# Patient Record
Sex: Male | Born: 1980 | Race: White | Hispanic: No | Marital: Married | Smoking: Never smoker
Health system: Southern US, Community
[De-identification: ages and names within clinical notes are randomized; demographics above are authoritative.]

## PROBLEM LIST (undated history)

## (undated) DIAGNOSIS — T7840XA Allergy, unspecified, initial encounter: Secondary | ICD-10-CM

## (undated) DIAGNOSIS — J45909 Unspecified asthma, uncomplicated: Secondary | ICD-10-CM

## (undated) HISTORY — DX: Unspecified asthma, uncomplicated: J45.909

## (undated) HISTORY — DX: Allergy, unspecified, initial encounter: T78.40XA

---

## 2014-10-13 ENCOUNTER — Ambulatory Visit (INDEPENDENT_AMBULATORY_CARE_PROVIDER_SITE_OTHER): Payer: BLUE CROSS/BLUE SHIELD | Admitting: Family

## 2014-10-13 ENCOUNTER — Encounter: Payer: Self-pay | Admitting: Family

## 2014-10-13 ENCOUNTER — Other Ambulatory Visit (INDEPENDENT_AMBULATORY_CARE_PROVIDER_SITE_OTHER): Payer: BLUE CROSS/BLUE SHIELD

## 2014-10-13 VITALS — BP 130/88 | HR 79 | Temp 98.4°F | Resp 18 | Ht 70.0 in | Wt 189.1 lb

## 2014-10-13 DIAGNOSIS — Z0000 Encounter for general adult medical examination without abnormal findings: Secondary | ICD-10-CM | POA: Diagnosis not present

## 2014-10-13 LAB — COMPREHENSIVE METABOLIC PANEL
ALK PHOS: 61 U/L (ref 39–117)
ALT: 30 U/L (ref 0–53)
AST: 22 U/L (ref 0–37)
Albumin: 4.3 g/dL (ref 3.5–5.2)
BILIRUBIN TOTAL: 0.8 mg/dL (ref 0.2–1.2)
BUN: 14 mg/dL (ref 6–23)
CHLORIDE: 104 meq/L (ref 96–112)
CO2: 27 mEq/L (ref 19–32)
CREATININE: 0.8 mg/dL (ref 0.40–1.50)
Calcium: 9.4 mg/dL (ref 8.4–10.5)
GFR: 118.02 mL/min (ref >60.00)
Glucose, Bld: 92 mg/dL (ref 70–99)
Potassium: 4.1 mEq/L (ref 3.5–5.1)
Sodium: 137 mEq/L (ref 135–145)
Total Protein: 6.9 g/dL (ref 6.0–8.3)

## 2014-10-13 LAB — LIPID PANEL
CHOLESTEROL: 223 mg/dL — AB (ref 0–200)
HDL: 41.4 mg/dL (ref >39.00)
LDL CALC: 158 mg/dL — AB (ref 0–99)
NonHDL: 181.6
Total CHOL/HDL Ratio: 5
Triglycerides: 119 mg/dL (ref 0.0–149.0)
VLDL: 23.8 mg/dL (ref 0.0–40.0)

## 2014-10-13 LAB — CBC
HCT: 41.9 % (ref 39.0–52.0)
Hemoglobin: 14.1 g/dL (ref 13.0–17.0)
MCHC: 33.7 g/dL (ref 30.0–36.0)
MCV: 86.9 fl (ref 78.0–100.0)
PLATELETS: 217 10*3/uL (ref 150.0–400.0)
RBC: 4.82 Mil/uL (ref 4.22–5.81)
RDW: 12.4 % (ref 11.5–15.5)
WBC: 6.1 10*3/uL (ref 4.0–10.5)

## 2014-10-13 LAB — TSH: TSH: 1.59 u[IU]/mL (ref 0.35–4.50)

## 2014-10-13 NOTE — Assessment & Plan Note (Addendum)
1) Anticipatory Guidance: Discussed importance of wearing a seatbelt while driving and not texting while driving; changing batteries in smoke detector at least once annually; wearing suntan lotion when outside; eating a balanced and moderate diet; getting physical activity at least 30 minutes per day.  2) Immunizations / Screenings / Labs:  All immunizations are up to date per recommendations. Due for a dental exam. All other screenings are up to date. Obtain CBC, CMET, Lipid profile and TSH.   Overall well exam. Minimal risk factors for cardiovascular disease at this time. Continue current healthy lifestyle behaviors and healthy choices. BMI indicates overweight. Discussed importance of nutrient density and continued physical activity. Notes snoring for several months and would like to pursue further evaluation. Referral to sleep medicine placed. Follow-up prevention exam 1 year. Follow-up office visit pending lab work.

## 2014-10-13 NOTE — Progress Notes (Signed)
 Subjective:    Patient ID: Austin Carr, male    DOB: 11/23/1980, 34 y.o.   MRN: [id number]  Chief Complaint  Patient presents with  . Establish Care    CPE, fasting     HPI:  Austin Carr is a 34 y.o. male who presents today for an annual wellness visit.   1) Health Maintenance -   Diet - Averages 3 meals per day consisting of starches, chicken/meat, sandwiches, dairy, fruit and vegetables; 2-3 cups of caffeine per day  Exercise - 3-4x per week mainly cardio.    2) Preventative Exams / Immunizations:  Dental -- Due for exam  Vision -- Up to date   Health Maintenance  Topic Date Due  . HIV Screening  05/08/1996  . INFLUENZA VACCINE  12/01/2014  . TETANUS/TDAP  10/31/2023    There is no immunization history on file for this patient.  Allergies  Allergen Reactions  . Cephalosporins Hives  . Sulfa Antibiotics Hives     No outpatient prescriptions prior to visit.   No facility-administered medications prior to visit.     Past Medical History  Diagnosis Date  . Asthma   . Allergy      History reviewed. No pertinent past surgical history.   Family History  Problem Relation Age of Onset  . Healthy Mother   . Benign prostatic hyperplasia Father   . Stroke Father   . Healthy Maternal Grandmother   . Multiple myeloma Maternal Grandfather      History   Social History  . Marital Status: Married    Spouse Name: N/A  . Number of Children: 1  . Years of Education: 18+   Occupational History  . 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 Smoking status: Never Smoker   . Smokeless tobacco: Never Used  . Alcohol Use: 0.0 oz/week    0 Standard drinks or equivalent per week     Comment: Occasionally  . Drug Use: No  . Sexual Activity: Not on file   Other Topics Concern  . Not on file   Social History Narrative   Fun: Plays music, run, cycling and hiking   Completed his doctorate.   Denies religious beliefs effecting  health care.     Review of Systems  Constitutional: Denies fever, chills, fatigue, or significant weight gain/loss. HENT: Head: Denies headache or neck pain Ears: Denies changes in hearing, ringing in ears, earache, drainage Nose: Denies discharge, stuffiness, itching, nosebleed, sinus pain Throat: Denies sore throat, hoarseness, dry mouth, sores, thrush Eyes: Denies loss/changes in vision, pain, redness, blurry/double vision, flashing lights Cardiovascular: Denies chest pain/discomfort, tightness, palpitations, shortness of breath with activity, difficulty lying down, swelling, sudden awakening with shortness of breath Respiratory: Denies shortness of breath, cough, sputum production, wheezing Snoring Gastrointestinal: Denies dysphasia, heartburn, change in appetite, nausea, change in bowel habits, rectal bleeding, constipation, diarrhea, yellow skin or eyes Genitourinary: Denies frequency, urgency, burning/pain, blood in urine, incontinence, change in urinary strength. Musculoskeletal: Denies muscle/joint pain, stiffness, back pain, redness or swelling of joints, trauma Skin: Denies rashes, lumps, itching, dryness, color changes, or hair/nail changes Neurological: Denies dizziness, fainting, seizures, weakness, numbness, tingling, tremor Psychiatric - Denies nervousness, stress, depression or memory loss Endocrine: Denies heat or cold intolerance, sweating, frequent urination, excessive thirst, changes in appetite Hematologic: Denies ease of bruising or bleeding     Objective:    BP 130/88 mmHg  Pulse 79  Temp(Src) 98.4 F (36.9   C) (Oral)  Resp 18  Ht 5' 10" (1.778 m)  Wt 189 lb 1.9 oz (85.784 kg)  BMI 27.14 kg/m2  SpO2 98% Nursing note and vital signs reviewed.  Physical Exam  Constitutional: He is oriented to person, place, and time. He appears well-developed and well-nourished.  HENT:  Head: Normocephalic.  Right Ear: Hearing, tympanic membrane, external ear and ear  canal normal.  Left Ear: Hearing, tympanic membrane, external ear and ear canal normal.  Nose: Nose normal.  Mouth/Throat: Uvula is midline, oropharynx is clear and moist and mucous membranes are normal.  Eyes: Conjunctivae and EOM are normal. Pupils are equal, round, and reactive to light.  Neck: Neck supple. No JVD present. No tracheal deviation present. No thyromegaly present.  Cardiovascular: Normal rate, regular rhythm, normal heart sounds and intact distal pulses.   Pulmonary/Chest: Effort normal and breath sounds normal.  Abdominal: Soft. Bowel sounds are normal. He exhibits no distension and no mass. There is no tenderness. There is no rebound and no guarding.  Musculoskeletal: Normal range of motion. He exhibits no edema or tenderness.  Lymphadenopathy:    He has no cervical adenopathy.  Neurological: He is alert and oriented to person, place, and time. He has normal reflexes. No cranial nerve deficit. He exhibits normal muscle tone. Coordination normal.  Skin: Skin is warm and dry.  Psychiatric: He has a normal mood and affect. His behavior is normal. Judgment and thought content normal.       Assessment & Plan:   Problem List Items Addressed This Visit      Other   Routine general medical examination at a health care facility - Primary    1) Anticipatory Guidance: Discussed importance of wearing a seatbelt while driving and not texting while driving; changing batteries in smoke detector at least once annually; wearing suntan lotion when outside; eating a balanced and moderate diet; getting physical activity at least 30 minutes per day.  2) Immunizations / Screenings / Labs:  All immunizations are up to date per recommendations. Due for a dental exam. All other screenings are up to date. Obtain CBC, CMET, Lipid profile and TSH.   Overall well exam. Minimal risk factors for cardiovascular disease at this time. Continue current healthy lifestyle behaviors and healthy choices.  BMI indicates overweight. Discussed importance of nutrient density and continued physical activity. Notes snoring for several months and would like to pursue further evaluation. Referral to sleep medicine placed. Follow-up prevention exam 1 year. Follow-up office visit pending lab work.      Relevant Orders   CBC   Lipid panel   Comprehensive metabolic panel   TSH   Ambulatory referral to Pulmonology

## 2014-10-13 NOTE — Patient Instructions (Addendum)
Thank you for choosing [facility].  Summary/Instructions:  Please stop by the lab on the basement level of the building for your blood work. Your results will be released to MyChart (or called to you) after review, usually within 72 hours after test completion. If any changes need to be made, you will be notified at that same time.  If your symptoms worsen or fail to improve, please contact our office for further instruction, or in case of emergency go directly to the emergency room at the closest medical facility.   Health Maintenance A healthy lifestyle and preventative care can promote health and wellness.  Maintain regular health, dental, and eye exams.  Eat a healthy diet. Foods like vegetables, fruits, whole grains, low-fat dairy products, and lean protein foods contain the nutrients you need and are low in calories. Decrease your intake of foods high in solid fats, added sugars, and salt. Get information about a proper diet from your health care provider, if necessary.  Regular physical exercise is one of the most important things you can do for your health. Most adults should get at least 150 minutes of moderate-intensity exercise (any activity that increases your heart rate and causes you to sweat) each week. In addition, most adults need muscle-strengthening exercises on 2 or more days a week.   Maintain a healthy weight. The body mass index (BMI) is a screening tool to identify possible weight problems. It provides an estimate of body fat based on height and weight. Your health care provider can find your BMI and can help you achieve or maintain a healthy weight. For males 20 years and older:  A BMI below 18.5 is considered underweight.  A BMI of 18.5 to 24.9 is normal.  A BMI of 25 to 29.9 is considered overweight.  A BMI of 30 and above is considered obese.  Maintain normal blood lipids and cholesterol by exercising and minimizing your intake of saturated fat. Eat a  balanced diet with plenty of fruits and vegetables. Blood tests for lipids and cholesterol should begin at age 20 and be repeated every 5 years. If your lipid or cholesterol levels are high, you are over age 50, or you are at high risk for heart disease, you may need your cholesterol levels checked more frequently.Ongoing high lipid and cholesterol levels should be treated with medicines if diet and exercise are not working.  If you smoke, find out from your health care provider how to quit. If you do not use tobacco, do not start.  Lung cancer screening is recommended for adults aged 55-80 years who are at high risk for developing lung cancer because of a history of smoking. A yearly low-dose CT scan of the lungs is recommended for people who have at least a 30-pack-year history of smoking and are current smokers or have quit within the past 15 years. A pack year of smoking is smoking an average of 1 pack of cigarettes a day for 1 year (for example, a 30-pack-year history of smoking could mean smoking 1 pack a day for 30 years or 2 packs a day for 15 years). Yearly screening should continue until the smoker has stopped smoking for at least 15 years. Yearly screening should be stopped for people who develop a health problem that would prevent them from having lung cancer treatment.  If you choose to drink alcohol, do not have more than 2 drinks per day. One drink is considered to be 12 oz (360 mL) of beer,   5 oz (150 mL) of wine, or 1.5 oz (45 mL) of liquor.  Avoid the use of street drugs. Do not share needles with anyone. Ask for help if you need support or instructions about stopping the use of drugs.  High blood pressure causes heart disease and increases the risk of stroke. Blood pressure should be checked at least every 1-2 years. Ongoing high blood pressure should be treated with medicines if weight loss and exercise are not effective.  If you are 45-79 years old, ask your health care provider if  you should take aspirin to prevent heart disease.  Diabetes screening involves taking a blood sample to check your fasting blood sugar level. This should be done once every 3 years after age 45 if you are at a normal weight and without risk factors for diabetes. Testing should be considered at a younger age or be carried out more frequently if you are overweight and have at least 1 risk factor for diabetes.  Colorectal cancer can be detected and often prevented. Most routine colorectal cancer screening begins at the age of 50 and continues through age 75. However, your health care provider may recommend screening at an earlier age if you have risk factors for colon cancer. On a yearly basis, your health care provider may provide home test kits to check for hidden blood in the stool. A small camera at the end of a tube may be used to directly examine the colon (sigmoidoscopy or colonoscopy) to detect the earliest forms of colorectal cancer. Talk to your health care provider about this at age 50 when routine screening begins. A direct exam of the colon should be repeated every 5-10 years through age 75, unless early forms of precancerous polyps or small growths are found.  People who are at an increased risk for hepatitis B should be screened for this virus. You are considered at high risk for hepatitis B if:  You were born in a country where hepatitis B occurs often. Talk with your health care provider about which countries are considered high risk.  Your parents were born in a high-risk country and you have not received a shot to protect against hepatitis B (hepatitis B vaccine).  You have HIV or AIDS.  You use needles to inject street drugs.  You live with, or have sex with, someone who has hepatitis B.  You are a man who has sex with other men (MSM).  You get hemodialysis treatment.  You take certain medicines for conditions like cancer, organ transplantation, and autoimmune  conditions.  Hepatitis C blood testing is recommended for all people born from 1945 through 1965 and any individual with known risk factors for hepatitis C.  Healthy men should no longer receive prostate-specific antigen (PSA) blood tests as part of routine cancer screening. Talk to your health care provider about prostate cancer screening.  Testicular cancer screening is not recommended for adolescents or adult males who have no symptoms. Screening includes self-exam, a health care provider exam, and other screening tests. Consult with your health care provider about any symptoms you have or any concerns you have about testicular cancer.  Practice safe sex. Use condoms and avoid high-risk sexual practices to reduce the spread of sexually transmitted infections (STIs).  You should be screened for STIs, including gonorrhea and chlamydia if:  You are sexually active and are younger than 24 years.  You are older than 24 years, and your health care provider tells you that you are at   risk for this type of infection.  Your sexual activity has changed since you were last screened, and you are at an increased risk for chlamydia or gonorrhea. Ask your health care provider if you are at risk.  If you are at risk of being infected with HIV, it is recommended that you take a prescription medicine daily to prevent HIV infection. This is called pre-exposure prophylaxis (PrEP). You are considered at risk if:  You are a man who has sex with other men (MSM).  You are a heterosexual man who is sexually active with multiple partners.  You take drugs by injection.  You are sexually active with a partner who has HIV.  Talk with your health care provider about whether you are at high risk of being infected with HIV. If you choose to begin PrEP, you should first be tested for HIV. You should then be tested every 3 months for as long as you are taking PrEP.  Use sunscreen. Apply sunscreen liberally and  repeatedly throughout the day. You should seek shade when your shadow is shorter than you. Protect yourself by wearing long sleeves, pants, a wide-brimmed hat, and sunglasses year round whenever you are outdoors.  Tell your health care provider of new moles or changes in moles, especially if there is a change in shape or color. Also, tell your health care provider if a mole is larger than the size of a pencil eraser.  A one-time screening for abdominal aortic aneurysm (AAA) and surgical repair of large AAAs by ultrasound is recommended for men aged 65-75 years who are current or former smokers.  Stay current with your vaccines (immunizations). Document Released: 10/15/2007 Document Revised: 04/23/2013 Document Reviewed: 09/13/2010 Exit[employer] Patient Information 2015 [employer]. This information is not intended to replace advice given to you by your health care provider. Make sure you discuss any questions you have with your health care provider.  

## 2014-10-14 ENCOUNTER — Telehealth: Payer: Self-pay | Admitting: Family

## 2014-10-14 NOTE — Telephone Encounter (Signed)
Please inform patient that his lab results indicate that his kidney function, liver function, electrolytes, thyroid function, and white/red blood cells are all within the normal ranges. His cholesterol was slightly elevated with a total cholesterol of 238 with a goal of <200 and a LDL, or bad cholesterol of 158 with a goal of <100. No medication is required however I recommend following a heart healthy diet which includes decreasing saturated fats and increasing fiber. This will help to reduce your overall cholesterol. Please plan to follow up for a prevention exam in 1 year.

## 2014-10-15 NOTE — Telephone Encounter (Signed)
LVM for pt to call back.

## 2014-10-16 NOTE — Telephone Encounter (Signed)
Pt aware of results 

## 2014-10-16 NOTE — Telephone Encounter (Signed)
LVM for pt to call back.

## 2014-11-05 ENCOUNTER — Encounter: Payer: Self-pay | Admitting: Family

## 2014-11-25 ENCOUNTER — Telehealth: Payer: Self-pay | Admitting: Family

## 2014-11-25 NOTE — Telephone Encounter (Signed)
Patient is requesting a call back. He has elevated liver enzymes and cholesterol. He wants some advice regarding this and plan of care.

## 2014-11-26 ENCOUNTER — Ambulatory Visit (INDEPENDENT_AMBULATORY_CARE_PROVIDER_SITE_OTHER): Payer: BLUE CROSS/BLUE SHIELD | Admitting: Internal Medicine

## 2014-11-26 ENCOUNTER — Encounter: Payer: Self-pay | Admitting: Internal Medicine

## 2014-11-26 ENCOUNTER — Other Ambulatory Visit (INDEPENDENT_AMBULATORY_CARE_PROVIDER_SITE_OTHER): Payer: BLUE CROSS/BLUE SHIELD

## 2014-11-26 ENCOUNTER — Other Ambulatory Visit: Payer: Self-pay | Admitting: Family

## 2014-11-26 VITALS — BP 112/66 | HR 61 | Ht 70.0 in | Wt 190.8 lb

## 2014-11-26 DIAGNOSIS — R748 Abnormal levels of other serum enzymes: Secondary | ICD-10-CM | POA: Diagnosis not present

## 2014-11-26 DIAGNOSIS — J4520 Mild intermittent asthma, uncomplicated: Secondary | ICD-10-CM

## 2014-11-26 DIAGNOSIS — J3089 Other allergic rhinitis: Secondary | ICD-10-CM

## 2014-11-26 DIAGNOSIS — G4733 Obstructive sleep apnea (adult) (pediatric): Secondary | ICD-10-CM | POA: Diagnosis not present

## 2014-11-26 DIAGNOSIS — J302 Other seasonal allergic rhinitis: Secondary | ICD-10-CM

## 2014-11-26 DIAGNOSIS — J309 Allergic rhinitis, unspecified: Secondary | ICD-10-CM

## 2014-11-26 LAB — HEPATIC FUNCTION PANEL
ALK PHOS: 69 U/L (ref 39–117)
ALT: 70 U/L — ABNORMAL HIGH (ref 0–53)
AST: 36 U/L (ref 0–37)
Albumin: 4.6 g/dL (ref 3.5–5.2)
BILIRUBIN TOTAL: 0.6 mg/dL (ref 0.2–1.2)
Bilirubin, Direct: 0.1 mg/dL (ref 0.0–0.3)
Total Protein: 7.3 g/dL (ref 6.0–8.3)

## 2014-11-26 NOTE — Assessment & Plan Note (Signed)
Recently completed blood work for [employer] [employer] and was noted to have elevated liver enzymes. Previous AST/ALT were within normal limits about 1 month ago. Recheck hepatic panel.

## 2014-11-26 NOTE — Assessment & Plan Note (Signed)
Mostly seasonal allergic rhinitis managed with Flonase and Chlor-Trimeton

## 2014-11-26 NOTE — Progress Notes (Signed)
11/26/14- 68 yoM never smoker referred by Mauricio Po, PA.  pt states his wife wants him to have consult due to snoring; no mention of stopping breathing at night.  Wife complains of his chronic snoring with concern of obstructive sleep apnea. Not positional. She is not here to describe witnessed apneas. Some daytime sleepiness noted occasionally without impact on driving. Bedtime between 10 PM and midnight, sleep latency 15 minutes, awake and once or more by new baby before up between 6 and 8 AM. 10 pound weight gain last 2 years. Seasonal allergic rhinitis especially March through May treated with Flonase and Chlor-Trimeton. Well-controlled exercise-induced asthma. ENT-no surgery  Prior to Admission medications   Medication Sig Start Date End Date Taking? Authorizing Provider  albuterol (PROVENTIL HFA;VENTOLIN HFA) 108 (90 BASE) MCG/ACT inhaler Inhale 2 puffs into the lungs every 6 (six) hours as needed for wheezing or shortness of breath.   Yes Historical Provider, MD  chlorpheniramine (CHLOR-TRIMETON) 4 MG tablet Take 4 mg by mouth daily. Takes March through May for Spring time allergies   Yes Historical Provider, MD  fluticasone (FLONASE) 50 MCG/ACT nasal spray Place 2 sprays into both nostrils daily. Takes March through May for Spring time allergies   Yes Historical Provider, MD   Past Medical History  Diagnosis Date  . Asthma   . Allergy    No past surgical history on file. Family History  Problem Relation Age of Onset  . Healthy Mother   . Benign prostatic hyperplasia Father   . Stroke Father   . Healthy Maternal Grandmother   . Multiple myeloma Maternal Grandfather   . Allergies Brother   . Asthma Brother    History   Social History  . Marital Status: Married    Spouse Name: N/A  . Number of Children: 1  . Years of Education: 18+   Occupational History  . 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 Smoking status: Never Smoker   . Smokeless  tobacco: Never Used  . Alcohol Use: 0.0 oz/week    0 Standard drinks or equivalent per week     Comment: Occasionally  . Drug Use: No  . Sexual Activity: Not on file   Other Topics Concern  . Not on file   Social History Narrative   Fun: Plays music, run, cycling and hiking   Completed his doctorate.   Denies religious beliefs effecting health care.    ROS-see HPI   Negative unless "+" Constitutional:    weight loss, night sweats, fevers, chills, fatigue, lassitude. HEENT:    headaches, difficulty swallowing, tooth/dental problems, sore throat,       sneezing, itching, ear ache, nasal congestion, post nasal drip, snoring CV:    chest pain, orthopnea, PND, swelling in lower extremities, anasarca,                                               dizziness, palpitations Resp:   shortness of breath with exertion or at rest.                productive cough,   non-productive cough, coughing up of blood.              change in color of mucus.  wheezing.   Skin:    rash or lesions. GI:  No-  heartburn, indigestion, abdominal pain, nausea, vomiting, diarrhea,                 change in bowel habits, loss of appetite GU: dysuria, change in color of urine, no urgency or frequency.   flank pain. MS:   joint pain, stiffness, decreased range of motion, back pain. Neuro-     nothing unusual Psych:  change in mood or affect.  depression or anxiety.   memory loss.   OBJ- Physical Exam General- Alert, Oriented, Affect-appropriate, Distress- none acute Skin- rash-none, lesions- none, excoriation- none Lymphadenopathy- none Head- atraumatic            Eyes- Gross vision intact, PERRLA, conjunctivae and secretions clear            Ears- Hearing, canals-normal            Nose- Clear, no-Septal dev, mucus, polyps, erosion, perforation             Throat- Mallampati II-III , mucosa clear , drainage- none, tonsils present not obstructing Neck- flexible , trachea midline, no stridor , thyroid nl, carotid  no bruit Chest - symmetrical excursion , unlabored           Heart/CV- RRR , no murmur , no gallop  , no rub, nl s1 s2                           - JVD- none , edema- none, stasis changes- none, varices- none           Lung- clear to P&A, wheeze- none, cough- none , dullness-none, rub- none           Chest wall-  Abd-  Br/ Gen/ Rectal- Not done, not indicated Extrem- cyanosis- none, clubbing, none, atrophy- none, strength- nl Neuro- grossly intact to observation

## 2014-11-26 NOTE — Telephone Encounter (Signed)
Left HIPPA appropriate voicemail to call back.

## 2014-11-26 NOTE — Telephone Encounter (Signed)
Spoke with patient and his wife regarding newly found elevated liver enzyme test when being evaluated for life insurance. Will obtain hepatic function panel. All questions were answered and patient declined any additional information.

## 2014-11-26 NOTE — Assessment & Plan Note (Signed)
Differential diagnosis is between obstructive sleep apnea and simple snoring aggravated by rhinitis Plan-polysomnogram

## 2014-11-26 NOTE — Assessment & Plan Note (Signed)
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

## 2014-11-26 NOTE — Patient Instructions (Addendum)
Order- schedule unattended home sleep study   Dx OSA  For simple snoring - an otc mouthpiece, nasal strips and sleep off flat of back may help  We will get you back after your sleep study to go over results

## 2014-11-27 ENCOUNTER — Telehealth: Payer: Self-pay | Admitting: Family

## 2014-11-27 DIAGNOSIS — R748 Abnormal levels of other serum enzymes: Secondary | ICD-10-CM

## 2014-11-27 NOTE — Telephone Encounter (Signed)
Please inform patient that his liver function tests were improved, however his ALT which measures the liver is slightly elevated still. I do not have an exact reason for the elevation and it is most likely not related to the alcohol. I would advise decreasing alcohol to 1 drink per day and I will order an ultrasound to completed to be on the safe side. Is he using Tylenol? Most likely it will come back down on its own.

## 2014-12-01 NOTE — Telephone Encounter (Signed)
LVM for pt to call back.

## 2014-12-03 ENCOUNTER — Telehealth: Payer: Self-pay | Admitting: Internal Medicine

## 2014-12-03 DIAGNOSIS — G4733 Obstructive sleep apnea (adult) (pediatric): Secondary | ICD-10-CM

## 2014-12-03 NOTE — Telephone Encounter (Signed)
Pt is aware that insurance will not cover HST and therefore will need to do In lab sleep study. Per CY order split night study. Order placed to [facility].

## 2014-12-09 ENCOUNTER — Encounter: Payer: Self-pay | Admitting: Family

## 2014-12-09 ENCOUNTER — Ambulatory Visit
Admission: RE | Admit: 2014-12-09 | Discharge: 2014-12-09 | Disposition: A | Discharge status: Home / Self Care | Payer: BLUE CROSS/BLUE SHIELD | Source: Ambulatory Visit | Attending: Family | Admitting: Family

## 2014-12-09 DIAGNOSIS — R748 Abnormal levels of other serum enzymes: Secondary | ICD-10-CM

## 2014-12-15 ENCOUNTER — Other Ambulatory Visit (INDEPENDENT_AMBULATORY_CARE_PROVIDER_SITE_OTHER): Payer: BLUE CROSS/BLUE SHIELD

## 2014-12-15 ENCOUNTER — Encounter: Payer: Self-pay | Admitting: Family

## 2014-12-15 ENCOUNTER — Ambulatory Visit (INDEPENDENT_AMBULATORY_CARE_PROVIDER_SITE_OTHER): Payer: BLUE CROSS/BLUE SHIELD | Admitting: Family

## 2014-12-15 VITALS — BP 126/80 | HR 56 | Temp 97.9°F | Resp 16 | Wt 187.0 lb

## 2014-12-15 DIAGNOSIS — R748 Abnormal levels of other serum enzymes: Secondary | ICD-10-CM

## 2014-12-15 NOTE — Patient Instructions (Addendum)
Thank you for choosing [facility].  Summary/Instructions:  Please stop by the lab on the basement level of the building for your blood work. Your results will be released to MyChart (or called to you) after review, usually within 72 hours after test completion. If any changes need to be made, you will be notified at that same time.  If your symptoms worsen or fail to improve, please contact our office for further instruction, or in case of emergency go directly to the emergency room at the closest medical facility.     

## 2014-12-15 NOTE — Assessment & Plan Note (Signed)
Undetermined cause of elevated liver enzymes, although possibly related to fatty liver disease. Obtain liver panel to confirm. Follow up with radiology for confirmation of diagnosis. Continue to monitor and manage potential risk factors.

## 2014-12-15 NOTE — Progress Notes (Signed)
Pre visit review using our clinic review tool, if applicable. No additional management support is needed unless otherwise documented below in the visit note. 

## 2014-12-15 NOTE — Progress Notes (Signed)
   Subjective:    Patient ID: Austin Carr, male    DOB: [birth year], 34 y.o.   MRN: [id number]  Chief Complaint  Patient presents with  . Follow-up    Allergies    HPI:  Austin Carr is a 34 y.o. male with a PMH of asthma, elevated LFTs and snoring who presents today for a follow up office visit.     1.) Elevated LFTs - Previously noted to have elevated liver enzymes when applying for life insurance. Originally his values were AST of 90 and an ALT of 270. Repeat blood work showed a normal AST and an ALT of 70. Limited US of the abdomen showed mild heterogeneous consistent with fatty infiltration and/or hepatocellular disease. Currently denies the associated symptoms of abdominal pain, nausea, or changes in urine color. Question possible causes of this raise in liver function tests.    Allergies  Allergen Reactions  . Cephalosporins Hives    Itching on inside of body  . Sulfa Antibiotics Hives    Current Outpatient Prescriptions on File Prior to Visit  Medication Sig Dispense Refill  . albuterol (PROVENTIL HFA;VENTOLIN HFA) 108 (90 BASE) MCG/ACT inhaler Inhale 2 puffs into the lungs every 6 (six) hours as needed for wheezing or shortness of breath.    . chlorpheniramine (CHLOR-TRIMETON) 4 MG tablet Take 4 mg by mouth daily. Takes March through May for Spring time allergies    . fluticasone (FLONASE) 50 MCG/ACT nasal spray Place 2 sprays into both nostrils daily. Takes March through May for Spring time allergies     No current facility-administered medications on file prior to visit.     Review of Systems  Constitutional: Negative for fever and chills.  Gastrointestinal: Negative for nausea, vomiting, abdominal pain, diarrhea, constipation and abdominal distention.      Objective:    BP 126/80 mmHg  Pulse 56  Temp(Src) 97.9 F (36.6 C) (Oral)  Resp 16  Wt 187 lb (84.823 kg)  SpO2 98% Nursing note and vital signs reviewed.  Physical Exam  Constitutional: He is oriented to  person, place, and time. He appears well-developed and well-nourished. No distress.  Cardiovascular: Normal rate, regular rhythm, normal heart sounds and intact distal pulses.   Pulmonary/Chest: Effort normal and breath sounds normal.  Abdominal: Soft. Normal appearance and bowel sounds are normal. He exhibits no distension and no mass. There is no tenderness. There is no rebound and no guarding.  Neurological: He is alert and oriented to person, place, and time.  Skin: Skin is warm and dry.  Psychiatric: He has a normal mood and affect. His behavior is normal. Judgment and thought content normal.       Assessment & Plan:   Problem List Items Addressed This Visit      Other   Elevated liver enzymes - Primary    Undetermined cause of elevated liver enzymes, although possibly related to fatty liver disease. Obtain liver panel to confirm. Follow up with radiology for confirmation of diagnosis. Continue to monitor and manage potential risk factors.       Relevant Orders   Hepatic function panel

## 2014-12-16 ENCOUNTER — Encounter: Payer: Self-pay | Admitting: Family

## 2014-12-16 LAB — HEPATIC FUNCTION PANEL
ALK PHOS: 56 U/L (ref 39–117)
ALT: 22 U/L (ref 0–53)
AST: 19 U/L (ref 0–37)
Albumin: 4.5 g/dL (ref 3.5–5.2)
BILIRUBIN DIRECT: 0.1 mg/dL (ref 0.0–0.3)
Total Bilirubin: 0.6 mg/dL (ref 0.2–1.2)
Total Protein: 7 g/dL (ref 6.0–8.3)

## 2015-02-13 ENCOUNTER — Encounter: Payer: Self-pay | Admitting: Family

## 2015-02-23 ENCOUNTER — Ambulatory Visit (HOSPITAL_BASED_OUTPATIENT_CLINIC_OR_DEPARTMENT_OTHER): Payer: BLUE CROSS/BLUE SHIELD | Attending: Internal Medicine | Admitting: Radiology

## 2015-02-23 VITALS — Ht 70.0 in | Wt 188.0 lb

## 2015-02-23 DIAGNOSIS — R0683 Snoring: Secondary | ICD-10-CM | POA: Diagnosis not present

## 2015-02-23 DIAGNOSIS — G4733 Obstructive sleep apnea (adult) (pediatric): Secondary | ICD-10-CM | POA: Insufficient documentation

## 2015-02-23 DIAGNOSIS — I493 Ventricular premature depolarization: Secondary | ICD-10-CM | POA: Insufficient documentation

## 2015-02-23 DIAGNOSIS — R5383 Other fatigue: Secondary | ICD-10-CM | POA: Insufficient documentation

## 2015-02-26 NOTE — Progress Notes (Signed)
  Patient Name: Austin Carr, Austin Carr Study Date: 02/23/2015 Gender: Male D.O.B: 1980/10/22 Age (years): [age] Referring Provider: Jetty Duhamellinton Ezechiel Stooksbury MD, ABSM Height (inches): 70 Interpreting Physician: Jetty Duhamellinton Jaritza Duignan MD, ABSM Weight (lbs): 188 RPSGT: Ulyess MortSpruill, Vicki BMI: 27 MRN: [id number] Neck Size: 14.00 CLINICAL INFORMATION Sleep Study Type: NPSG Indication for sleep study: Fatigue, OSA, Snoring Epworth Sleepiness Score: 2  SLEEP STUDY TECHNIQUE As per the AASM Manual for the Scoring of Sleep and Associated Events v2.3 (April 2016) with a hypopnea requiring 4% desaturations. The channels recorded and monitored were frontal, central and occipital EEG, electrooculogram (EOG), submentalis EMG (chin), nasal and oral airflow, thoracic and abdominal wall motion, anterior tibialis EMG, snore microphone, electrocardiogram, and pulse oximetry.  MEDICATIONS Patient's medications include: charted for review. Medications self-administered by patient during sleep study : No sleep medicine administered.  SLEEP ARCHITECTURE The study was initiated at 10:56:20 PM and ended at 4:51:57 AM. Sleep onset time was 16.3 minutes and the sleep efficiency was 92.4%. The total sleep time was 328.5 minutes. Stage REM latency was 71.0 minutes. The patient spent 2.13% of the night in stage N1 sleep, 63.01% in stage N2 sleep, 12.63% in stage N3 and 22.22% in REM. Alpha intrusion was absent. Supine sleep was 100.00%. Wake after sleep onset 10.8 minutes  RESPIRATORY PARAMETERS The overall apnea/hypopnea index (AHI) was 4.7 per hour. There were 1 total apneas, including 1 obstructive, 0 central and 0 mixed apneas. There were 25 hypopneas and 19 RERAs. The AHI during Stage REM sleep was 11.5 per hour. AHI while supine was 4.7 per hour. The mean oxygen saturation was 93.98%. The minimum SpO2 during sleep was 87.00%. Moderate snoring was noted during this study.  CARDIAC DATA The 2 lead EKG demonstrated sinus rhythm. The  mean heart rate was 70.13 beats per minute. Other EKG findings include: PVCs and PACs.  LEG MOVEMENT DATA The total PLMS were 0 with a resulting PLMS index of 0.00. Associated arousal with leg movement index was 0.0 .  IMPRESSIONS - No significant obstructive sleep apnea occurred during this study (AHI = 4.7/h). - No significant central sleep apnea occurred during this study (CAI = 0.0/h). - Mild oxygen desaturation was noted during this study (Min O2 = 87.00%). - The patient snored with Moderate snoring volume. - EKG findings include PVCs. - Clinically significant periodic limb movements did not occur during sleep. No significant associated arousals.  DIAGNOSIS - Normal study  RECOMMENDATIONS - Avoid alcohol, sedatives and other CNS depressants that may worsen sleep apnea and disrupt normal sleep architecture. - Sleep hygiene should be reviewed to assess factors that may improve sleep quality. - Weight management and regular exercise should be initiated or continued if appropriate.  Waymon BudgeYOUNG,Cailah Reach D Diplomate, American Board of Sleep Medicine  ELECTRONICALLY SIGNED ON:  02/26/2015, 7:44 AM [facility] PH: [phone]   FX: [phone] ACCREDITED BY THE AMERICAN ACADEMY OF SLEEP MEDICINE

## 2015-03-23 ENCOUNTER — Encounter: Payer: Self-pay | Admitting: Internal Medicine

## 2015-03-23 NOTE — Telephone Encounter (Signed)
Pt is requesting his sleep study results He does not have a pending appt. Please advise Dr. Maple HudsonYoung thanks

## 2015-03-24 NOTE — Telephone Encounter (Signed)
Patient notified of results via mychart. Nothing further needed.

## 2015-03-24 NOTE — Telephone Encounter (Signed)
The sleep study showed Mr Austin Carr is within normal limits. He does not have sleep apnea. There is simple snoring with normal oxygen scores. Treatment for stuffy nose, maintaining normal weight, and sleep off flat of back should help. We can see back [place] office follow-up if needed.

## 2015-04-03 ENCOUNTER — Encounter: Payer: Self-pay | Admitting: Family

## 2015-04-14 ENCOUNTER — Encounter: Payer: Self-pay | Admitting: Family

## 2015-04-14 MED ORDER — ALBUTEROL SULFATE HFA 108 (90 BASE) MCG/ACT IN AERS: 1.0000 | INHALATION_SPRAY | RESPIRATORY_TRACT | Status: DC | PRN

## 2015-06-22 ENCOUNTER — Encounter: Payer: Self-pay | Admitting: Family

## 2015-08-24 ENCOUNTER — Encounter: Payer: Self-pay | Admitting: Internal Medicine

## 2015-08-24 DIAGNOSIS — R0683 Snoring: Secondary | ICD-10-CM

## 2015-08-25 NOTE — Telephone Encounter (Signed)
Two options- suggest order referral to orthodontist Dr Althea GrimmerMark Katz to consider a fitted mouth piece for dx Primary Snoring, without OSA                        Can also refer to ENT for upper airway evaluation due to primary snoring without OSA

## 2015-08-25 NOTE — Telephone Encounter (Signed)
Replied to e-mail with options below.

## 2015-08-25 NOTE — Telephone Encounter (Signed)
CY - please advise. Thanks! 

## 2015-08-27 NOTE — Telephone Encounter (Signed)
Order placed for Dr Myrtis SerKatz ref  Emailed pt to inform him

## 2015-08-27 NOTE — Addendum Note (Signed)
Addended by: Christen ButterASKIN, Lorraina Spring M on: 08/27/2015 03:41 PM   Modules accepted: Orders

## 2015-09-02 ENCOUNTER — Encounter: Payer: Self-pay | Admitting: Family

## 2016-01-07 ENCOUNTER — Encounter: Payer: Self-pay | Admitting: Family

## 2016-01-07 MED ORDER — ALBUTEROL SULFATE HFA 108 (90 BASE) MCG/ACT IN AERS: 1.0000 | INHALATION_SPRAY | RESPIRATORY_TRACT | 2 refills | Status: DC | PRN

## 2016-02-18 ENCOUNTER — Ambulatory Visit (INDEPENDENT_AMBULATORY_CARE_PROVIDER_SITE_OTHER): Payer: BLUE CROSS/BLUE SHIELD | Admitting: Family

## 2016-02-18 ENCOUNTER — Encounter: Payer: Self-pay | Admitting: Family

## 2016-02-18 VITALS — BP 122/86 | HR 73 | Temp 98.0°F | Resp 16 | Ht 70.0 in | Wt 192.0 lb

## 2016-02-18 DIAGNOSIS — L989 Disorder of the skin and subcutaneous tissue, unspecified: Secondary | ICD-10-CM | POA: Diagnosis not present

## 2016-02-18 NOTE — Assessment & Plan Note (Signed)
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 No treatment necessary at this time. Referral placed to dermatology for further assessment and skin exam.

## 2016-02-18 NOTE — Patient Instructions (Signed)
Thank you for choosing [facility].  SUMMARY AND INSTRUCTIONS:  They will call to schedule your appointment with dermatology.   It appears as though a benign skin lesion.   Follow up:  If your symptoms worsen or fail to improve, please contact our office for further instruction, or in case of emergency go directly to the emergency room at the closest medical facility.

## 2016-02-18 NOTE — Progress Notes (Signed)
Subjective:    Patient ID: Austin Carr Merlino, male    DOB: 11/01/1980, 35 y.o.   MRN: [id number]  Chief Complaint  Patient presents with  . Lesion    has a bump on his side that would like checked, has a history of cancerous skin lesions in his family    HPI:  Austin Carr Brakebill is a 35 y.o. male who  has a past medical history of Allergy and Asthma. and presents today for an office visit.   This is a new problem. Associated symptom of a bump located on his right side has been going on for an undisclosed period of time. Unsure of any changes in size and described as skin colored. Denies any modifying factors or attempted treatments to make it better. Does have a family history of skin cancers.    Allergies  Allergen Reactions  . Cephalosporins Hives    Itching on inside of body  . Sulfa Antibiotics Hives      Outpatient Medications Prior to Visit  Medication Sig Dispense Refill  . albuterol (PROVENTIL HFA;VENTOLIN HFA) 108 (90 Base) MCG/ACT inhaler Inhale 1-2 puffs into the lungs every 4 (four) hours as needed for wheezing or shortness of breath. 1 Inhaler 2  . fluticasone (FLONASE) 50 MCG/ACT nasal spray Place 2 sprays into both nostrils daily. Takes March through May for Spring time allergies    . chlorpheniramine (CHLOR-TRIMETON) 4 MG tablet Take 4 mg by mouth daily. Takes March through May for Spring time allergies     No facility-administered medications prior to visit.       Review of Systems  Constitutional: Negative for chills and fever.  Skin: Positive for rash. Negative for color change.      Objective:    BP 122/86 (BP Location: Left Arm, Patient Position: Sitting, Cuff Size: Normal)   Pulse 73   Temp 98 F (36.7 C) (Oral)   Resp 16   Ht 5\' 10"  (1.778 m)   Wt 192 lb (87.1 kg)   SpO2 97%   BMI 27.55 kg/m  Nursing note and vital signs reviewed.  Physical Exam  Constitutional: He is oriented to person, place, and time. He appears well-developed and well-nourished.  No distress.  Cardiovascular: Normal rate, regular rhythm, normal heart sounds and intact distal pulses.   Pulmonary/Chest: Effort normal and breath sounds normal.  Neurological: He is alert and oriented to person, place, and time.  Skin: Skin is warm and dry.  Approximately 1/2 cm x 1/4 cm oblong tan/yellowish lesion located on the right side of his chest with no redness, discharge or signs of infection. Borders are sharply defined. There is not tenderness.   Psychiatric: He has a normal mood and affect. His behavior is normal. Judgment and thought content normal.       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 No treatment necessary at this time. Referral placed to dermatology for further assessment and skin exam.       Relevant Orders   Ambulatory referral to Dermatology    Other Visit Diagnoses   None.      I have discontinued Mr. Irving ShowsVance's chlorpheniramine. I am also having him maintain his fluticasone and albuterol.    Follow-up: Return if symptoms worsen or fail to improve.  Mauricio Po, FNP

## 2016-03-31 ENCOUNTER — Ambulatory Visit: Payer: Self-pay | Admitting: Family

## 2016-06-13 ENCOUNTER — Encounter: Payer: Self-pay | Admitting: Family

## 2016-07-31 IMAGING — US US ABDOMEN LIMITED
1 series · 14 of 25 positions shown · non-contrast
Comparison: None.

CLINICAL DATA: Elevated LFTs.

EXAM:
US ABDOMEN LIMITED - RIGHT UPPER QUADRANT

[Series 1: us abdomen limited · 0.20mm/px · 14 of 49 slices shown]
[im 1/49]
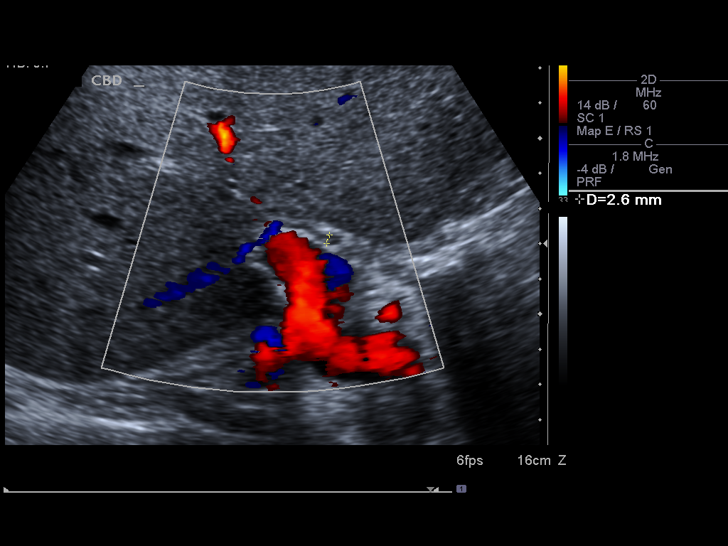
[im 5/49]
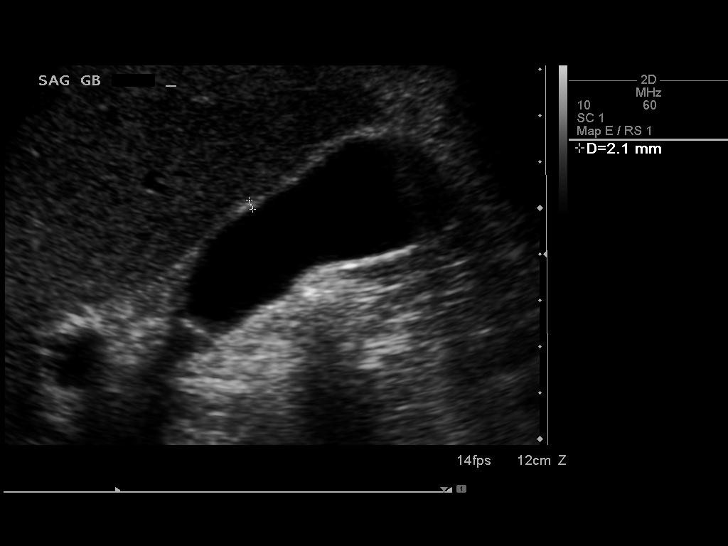
[im 9/49]
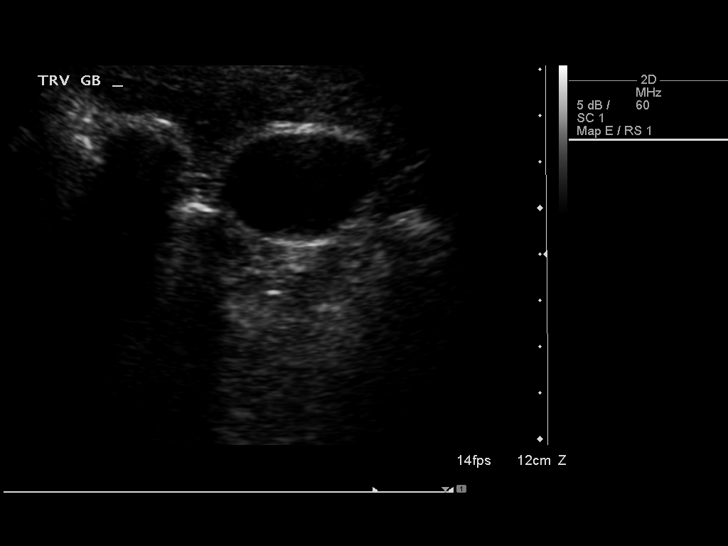
[im 13/49]
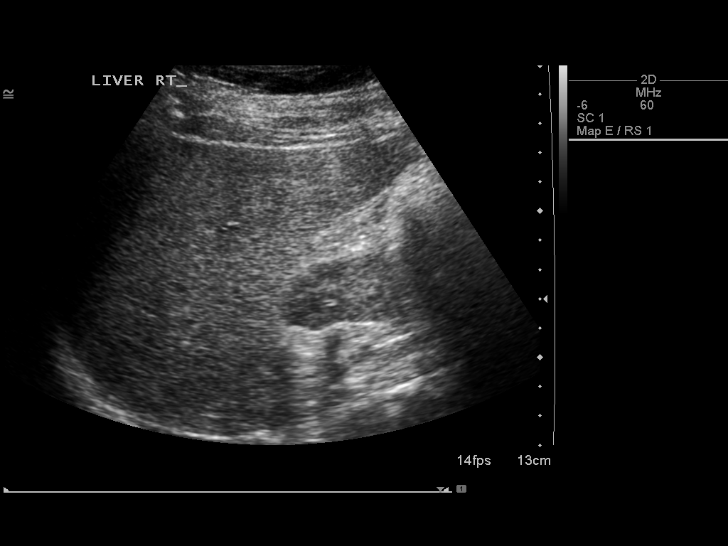
[im 17/49]
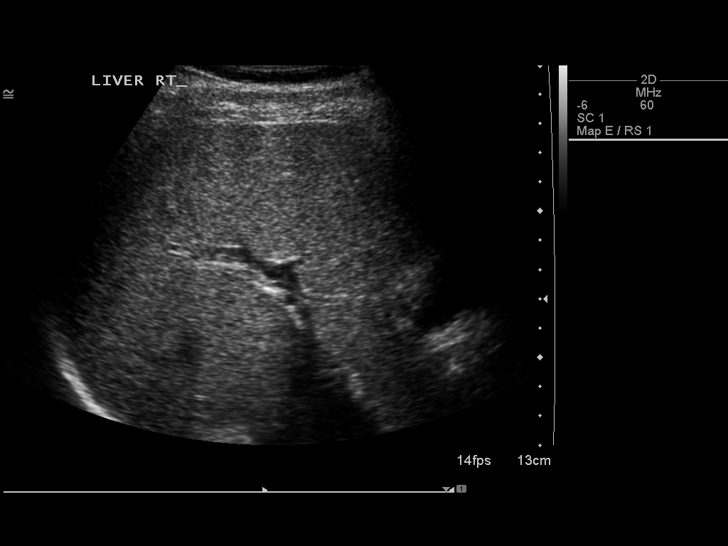
[im 19/49]
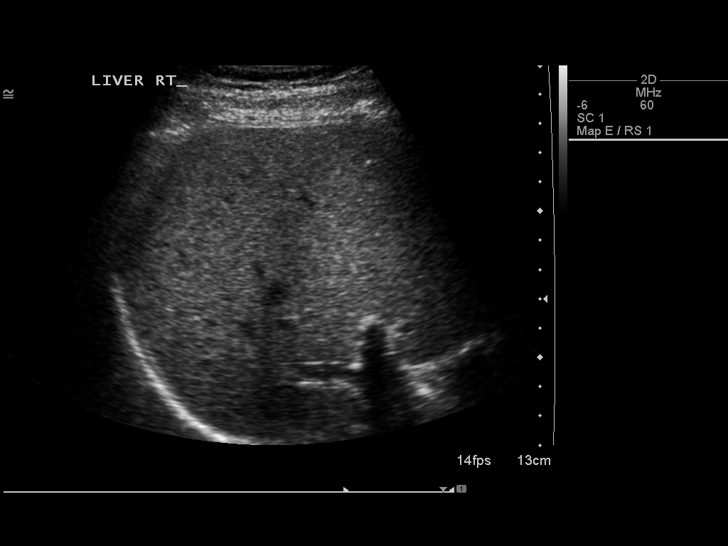
[im 23/49]
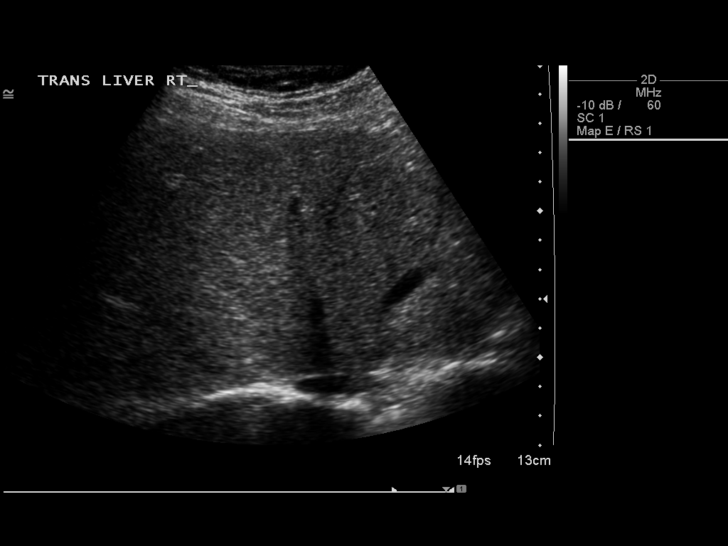
[im 27/49]
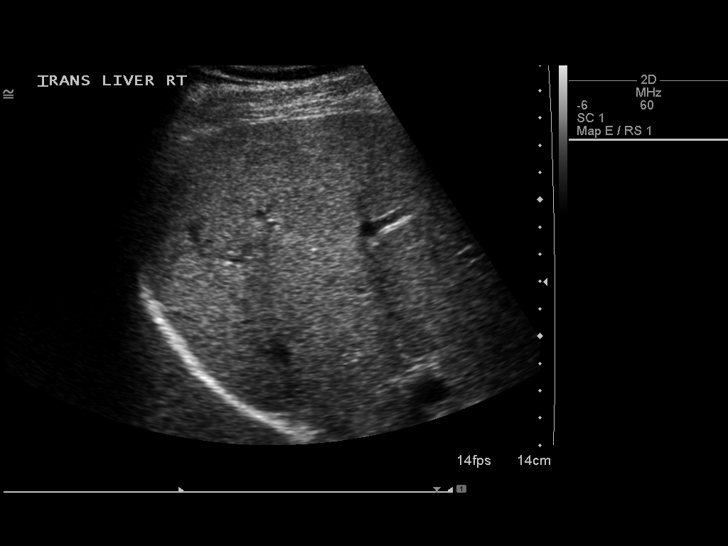
[im 31/49]
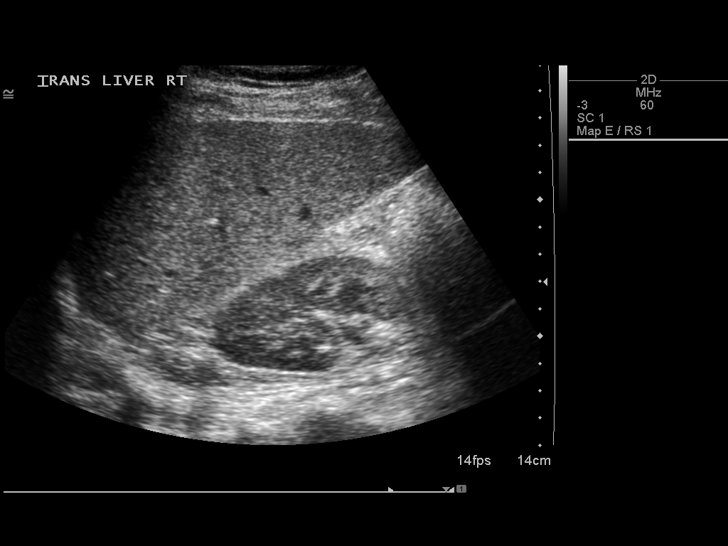
[im 33/49]
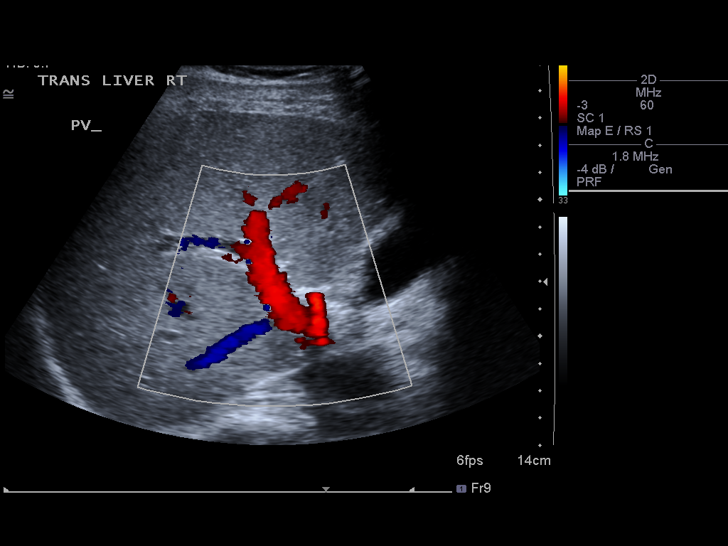
[im 37/49]
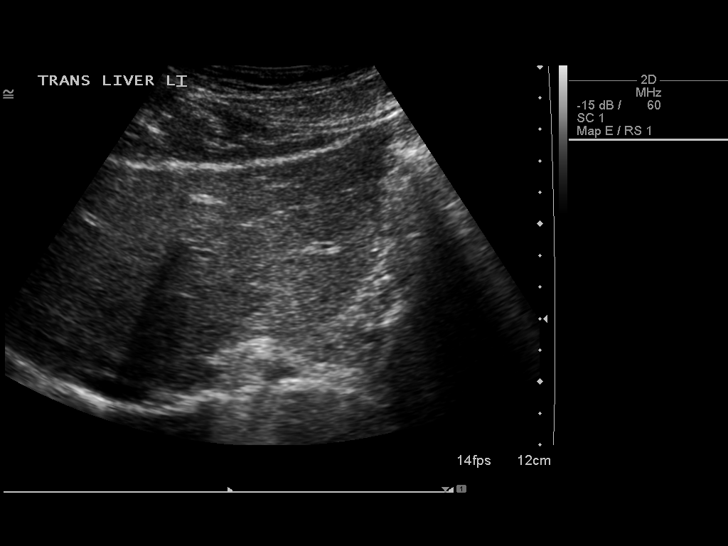
[im 41/49]
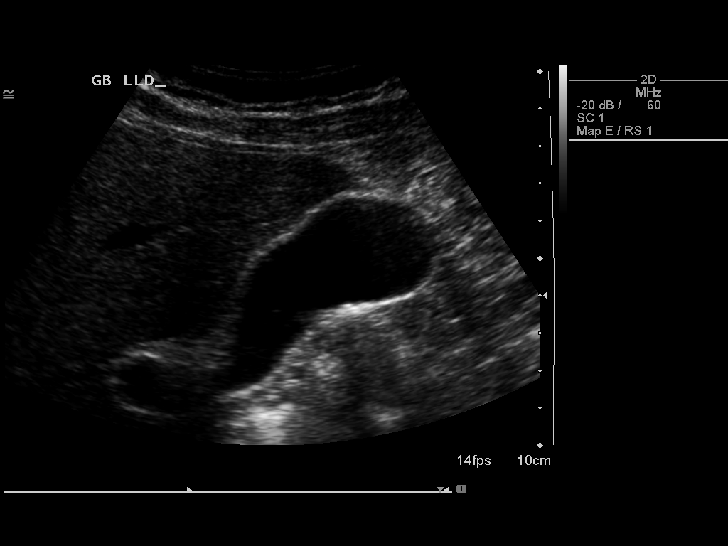
[im 45/49]
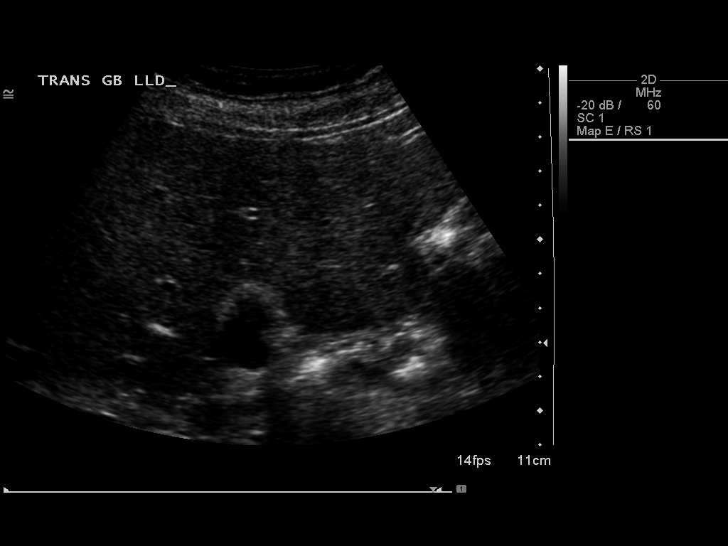
[im 49/49]
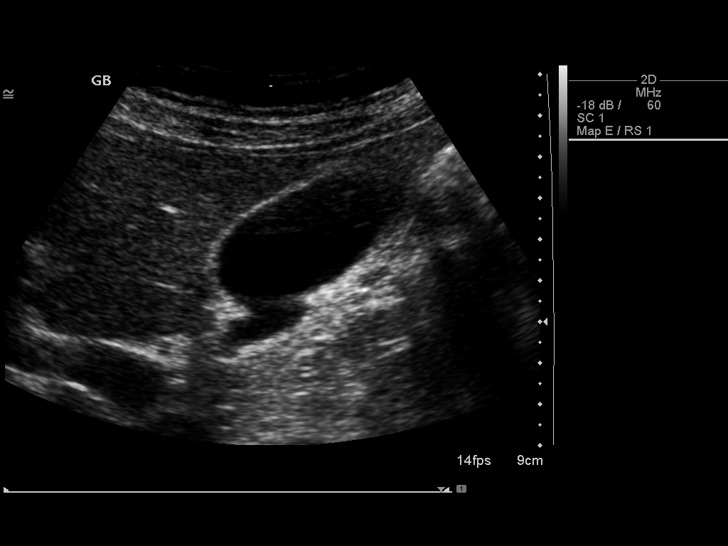

[14 of 25 positions shown; findings below may reference images not displayed]

FINDINGS: Gallbladder:

No gallstones or wall thickening visualized. No sonographic Murphy
sign noted.

Common bile duct:

Diameter: 2.6 mm

Liver:

No focal lesion identified. Within normal limits in parenchymal
echogenicity.
IMPRESSION: 1. Liver echotexture is mildly heterogeneous consistent with fatty
infiltration and/or hepatocellular disease. No focal hepatic
abnormality .

2.  Exam otherwise unremarkable.

## 2016-08-08 ENCOUNTER — Encounter: Payer: Self-pay | Admitting: Family

## 2016-08-28 ENCOUNTER — Encounter: Payer: Self-pay | Admitting: Family

## 2016-09-08 ENCOUNTER — Encounter: Payer: Self-pay | Admitting: Family

## 2016-09-08 ENCOUNTER — Ambulatory Visit (INDEPENDENT_AMBULATORY_CARE_PROVIDER_SITE_OTHER): Payer: BLUE CROSS/BLUE SHIELD | Admitting: Family

## 2016-09-08 VITALS — BP 120/80 | HR 74 | Temp 98.0°F | Resp 16 | Ht 70.0 in | Wt 193.7 lb

## 2016-09-08 DIAGNOSIS — N63 Unspecified lump in unspecified breast: Secondary | ICD-10-CM | POA: Diagnosis not present

## 2016-09-08 NOTE — Progress Notes (Signed)
Subjective:    Patient ID: Austin Carr, male    DOB: 1981-04-15, 36 y.o.   MRN: [id number]  Chief Complaint  Patient presents with  . Mass    state she feels a hards spot around left breast area that he notices is not on the other side    HPI:  Austin Carr is a 36 y.o. male who  has a past medical history of Allergy and Asthma. and presents today for an acute office visit.  This is a new problem. Associated symptom of a mass/lesion located to the left of his left nipple was first noted about 1 week ago. No nipple discharge or skin texture. No tenderness. Described as mobile and firm. Denies any modifying factors or attempted treatment. No family history of breast cancer. Denies any changes in size or texture since first noting it.   Allergies  Allergen Reactions  . Cephalosporins Hives    Itching on inside of body  . Sulfa Antibiotics Hives      Outpatient Medications Prior to Visit  Medication Sig Dispense Refill  . albuterol (PROVENTIL HFA;VENTOLIN HFA) 108 (90 Base) MCG/ACT inhaler Inhale 1-2 puffs into the lungs every 4 (four) hours as needed for wheezing or shortness of breath. 1 Inhaler 2  . fluticasone (FLONASE) 50 MCG/ACT nasal spray Place 2 sprays into both nostrils daily. Takes March through May for Spring time allergies     No facility-administered medications prior to visit.       No past surgical history on file.    Past Medical History:  Diagnosis Date  . Allergy   . Asthma       Review of Systems  Constitutional: Negative for chills and fever.  Respiratory: Negative for chest tightness and shortness of breath.        Positive for mass of left breast  Cardiovascular: Negative for chest pain.      Objective:    BP 120/80 (BP Location: Left Arm, Patient Position: Sitting, Cuff Size: Large)   Pulse 74   Temp 98 F (36.7 C) (Oral)   Resp 16   Ht 5\' 10"  (1.778 m)   Wt 193 lb 11.2 oz (87.9 kg)   SpO2 95%   BMI 27.79 kg/m  Nursing note and  vital signs reviewed.  Physical Exam  Constitutional: He is oriented to person, place, and time. He appears well-developed and well-nourished. No distress.  Cardiovascular: Normal rate, regular rhythm, normal heart sounds and intact distal pulses.   Pulmonary/Chest: Effort normal and breath sounds normal. He exhibits no mass and no tenderness. Left breast exhibits no inverted nipple, no nipple discharge, no skin change and no tenderness. Mass: Firm, mobile mass located in the 3 o'clock position.  Breasts are symmetrical.  Neurological: He is alert and oriented to person, place, and time.  Skin: Skin is warm and dry.  Psychiatric: He has a normal mood and affect. His behavior is normal. Judgment and thought content normal.       Assessment & Plan:   Problem List Items Addressed This Visit      Other   Breast mass in male - Primary    New onset breast mass located in the 3 o'clock position that is firm and mobile. No other current symptoms. Most likely benign with the recommendation to continue monitoring and follow up for mammogram and ultrasound if changes or if tenderness develops.           I am having Austin Carr maintain his  fluticasone and albuterol.   Follow-up: Return if symptoms worsen or fail to improve.  Jeanine Luzalone, Gregory, FNP

## 2016-09-08 NOTE — Patient Instructions (Signed)
Thank you for choosing [facility].  SUMMARY AND INSTRUCTIONS:  Continue to monitor your symptoms.  Check into the mammogram and ultrasound.   Follow up:  If your symptoms worsen or fail to improve, please contact our office for further instruction, or in case of emergency go directly to the emergency room at the closest medical facility.

## 2016-09-08 NOTE — Assessment & Plan Note (Signed)
New onset breast mass located in the 3 o'clock position that is firm and mobile. No other current symptoms. Most likely benign with the recommendation to continue monitoring and follow up for mammogram and ultrasound if changes or if tenderness develops.

## 2017-03-01 ENCOUNTER — Encounter: Payer: Self-pay | Admitting: Family

## 2017-03-10 ENCOUNTER — Encounter: Payer: Self-pay | Admitting: Nurse Practitioner

## 2017-03-10 ENCOUNTER — Ambulatory Visit: Payer: BLUE CROSS/BLUE SHIELD | Admitting: Nurse Practitioner

## 2017-03-10 VITALS — BP 128/80 | HR 69 | Temp 98.5°F | Wt 196.0 lb

## 2017-03-10 DIAGNOSIS — F4322 Adjustment disorder with anxiety: Secondary | ICD-10-CM | POA: Diagnosis not present

## 2017-03-10 MED ORDER — BUSPIRONE HCL 10 MG PO TABS: 10.0000 mg | ORAL_TABLET | Freq: Three times a day (TID) | ORAL | 0 refills | Status: AC

## 2017-03-10 NOTE — Progress Notes (Signed)
Subjective:  Patient ID: Austin Carr, male    DOB: 05/28/1980  Age: 36 y.o. MRN: [id number]  CC: Anxiety (will discuss with you)   Anxiety  Presents for initial visit. Onset was 1 to 6 months ago. The problem has been gradually worsening. Symptoms include decreased concentration, insomnia, irritability, muscle tension, nervous/anxious behavior, palpitations, panic and restlessness. Patient reports no chest pain, depressed mood, dry mouth, nausea, shortness of breath or suicidal ideas. Symptoms occur constantly. The severity of symptoms is causing significant distress and interfering with daily activities. The symptoms are aggravated by family issues, social activities and work stress. The quality of sleep is poor. Nighttime awakenings: one to two.   His past medical history is significant for anxiety/panic attacks. There is no history of anemia, bipolar disorder, depression, hyperthyroidism or suicide attempts. Past treatments include non-SSRI antidepressants. Compliance with prior treatments has been good.   Stress at home and mother's declining health, stressful job. Trouble with sleep, racing thought, panic attacks, feels overwhelm Onset this last fall Start counselling sessions. buspar used in past (was helpful) ETOH: 1-2beer a day 12oz. Caffeine: 2-3cups a NWG:NFAOZHday:coffee. No SI or HI. FH of depression (father) Brother (Alcohol abuse)  Outpatient Medications Prior to Visit  Medication Sig Dispense Refill  . albuterol (PROVENTIL HFA;VENTOLIN HFA) 108 (90 Base) MCG/ACT inhaler Inhale 1-2 puffs into the lungs every 4 (four) hours as needed for wheezing or shortness of breath. 1 Inhaler 2  . fluticasone (FLONASE) 50 MCG/ACT nasal spray Place 2 sprays into both nostrils daily. Takes March through May for Spring time allergies     No facility-administered medications prior to visit.     ROS See HPI  Objective:  BP 128/80   Pulse 69   Temp 98.5 F (36.9 C)   Wt 196 lb (88.9 kg)    SpO2 99%   BMI 28.12 kg/m   BP Readings from Last 3 Encounters:  03/10/17 128/80  09/08/16 120/80  02/18/16 122/86    Wt Readings from Last 3 Encounters:  03/10/17 196 lb (88.9 kg)  09/08/16 193 lb 11.2 oz (87.9 kg)  02/18/16 192 lb (87.1 kg)    Physical Exam  Constitutional: He is oriented to person, place, and time. No distress.  Cardiovascular: Normal rate.  Pulmonary/Chest: Effort normal.  Musculoskeletal: Normal range of motion.  Neurological: He is alert and oriented to person, place, and time.  Psychiatric: He has a normal mood and affect. His behavior is normal.  Vitals reviewed.   Lab Results  Component Value Date   WBC 6.1 10/13/2014   HGB 14.1 10/13/2014   HCT 41.9 10/13/2014   PLT 217.0 10/13/2014   GLUCOSE 92 10/13/2014   CHOL 223 (H) 10/13/2014   TRIG 119.0 10/13/2014   HDL 41.40 10/13/2014   LDLCALC 158 (H) 10/13/2014   ALT 22 12/15/2014   AST 19 12/15/2014   NA 137 10/13/2014   K 4.1 10/13/2014   CL 104 10/13/2014   CREATININE 0.80 10/13/2014   BUN 14 10/13/2014   CO2 27 10/13/2014   TSH 1.59 10/13/2014    [place] Abdomen Limited Ruq  Result Date: 12/09/2014 CLINICAL DATA:  Elevated LFTs. EXAM: US ABDOMEN LIMITED - RIGHT UPPER QUADRANT COMPARISON:  None. FINDINGS: Gallbladder: No gallstones or wall thickening visualized. No sonographic Murphy sign noted. Common bile duct: Diameter: 2.6 mm Liver: No focal lesion identified. Within normal limits in parenchymal echogenicity. IMPRESSION: 1. Liver echotexture is mildly heterogeneous consistent with fatty infiltration and/or hepatocellular disease. No focal hepatic  abnormality . 2.  Exam otherwise unremarkable. Electronically Signed   By: Maisie Fushomas  Register   On: 12/09/2014 10:21    Assessment & Plan:   Onalee HuaDavid was seen today for anxiety.  Diagnoses and all orders for this visit:  Adjustment disorder with anxious mood -     busPIRone (BUSPAR) 10 MG tablet; Take 1 tablet (10 mg total) 3 (three) times daily  for 14 days by mouth.   I am having Austin Comberavid Plessinger start on busPIRone. I am also having him maintain his fluticasone and albuterol.  Meds ordered this encounter  Medications  . busPIRone (BUSPAR) 10 MG tablet    Sig: Take 1 tablet (10 mg total) 3 (three) times daily for 14 days by mouth.    Dispense:  42 tablet    Refill:  0    Order Specific Question:   Supervising Provider    Answer:   Tresa GarterPLOTNIKOV, ALEKSEI V [[id number]]    Follow-up: Return in about 2 weeks (around 03/24/2017) for Anxiety with Apolonio SchneidersAshleigh, NP.  Alysia Pennaharlotte Nche, NP

## 2017-03-10 NOTE — Patient Instructions (Signed)
Take 1tab every 12hrs. May take an additional tab at noon is needed.  Buspirone tablets What is this medicine? BUSPIRONE (byoo SPYE rone) is used to treat anxiety disorders. This medicine may be used for other purposes; ask your health care provider or pharmacist if you have questions. COMMON BRAND NAME(S): BuSpar What should I tell my health care provider before I take this medicine? They need to know if you have any of these conditions: -kidney or liver disease -an unusual or allergic reaction to buspirone, other medicines, foods, dyes, or preservatives -pregnant or trying to get pregnant -breast-feeding How should I use this medicine? Take this medicine by mouth with a glass of water. Follow the directions on the prescription label. You may take this medicine with or without food. To ensure that this medicine always works the same way for you, you should take it either always with or always without food. Take your doses at regular intervals. Do not take your medicine more often than directed. Do not stop taking except on the advice of your doctor or health care professional. Talk to your pediatrician regarding the use of this medicine in children. Special care may be needed. Overdosage: If you think you have taken too much of this medicine contact a poison control center or emergency room at once. NOTE: This medicine is only for you. Do not share this medicine with others. What if I miss a dose? If you miss a dose, take it as soon as you can. If it is almost time for your next dose, take only that dose. Do not take double or extra doses. What may interact with this medicine? Do not take this medicine with any of the following medications: -linezolid -MAOIs like Carbex, Eldepryl, Marplan, Nardil, and Parnate -methylene blue -procarbazine This medicine may also interact with the following medications: -diazepam -digoxin -diltiazem -erythromycin -grapefruit  juice -haloperidol -medicines for mental depression or mood problems -medicines for seizures like carbamazepine, phenobarbital and phenytoin -nefazodone -other medications for anxiety -rifampin -ritonavir -some antifungal medicines like itraconazole, ketoconazole, and voriconazole -verapamil -warfarin This list may not describe all possible interactions. Give your health care provider a list of all the medicines, herbs, non-prescription drugs, or dietary supplements you use. Also tell them if you smoke, drink alcohol, or use illegal drugs. Some items may interact with your medicine. What should I watch for while using this medicine? Visit your doctor or health care professional for regular checks on your progress. It may take 1 to 2 weeks before your anxiety gets better. You may get drowsy or dizzy. Do not drive, use machinery, or do anything that needs mental alertness until you know how this drug affects you. Do not stand or sit up quickly, especially if you are an older patient. This reduces the risk of dizzy or fainting spells. Alcohol can make you more drowsy and dizzy. Avoid alcoholic drinks. What side effects may I notice from receiving this medicine? Side effects that you should report to your doctor or health care professional as soon as possible: -blurred vision or other vision changes -chest pain -confusion -difficulty breathing -feelings of hostility or anger -muscle aches and pains -numbness or tingling in hands or feet -ringing in the ears -skin rash and itching -vomiting -weakness Side effects that usually do not require medical attention (report to your doctor or health care professional if they continue or are bothersome): -disturbed dreams, nightmares -headache -nausea -restlessness or nervousness -sore throat and nasal congestion -stomach upset This list may  not describe all possible side effects. Call your doctor for medical advice about side effects. You may  report side effects to FDA at 1-800-FDA-1088. Where should I keep my medicine? Keep out of the reach of children. Store at room temperature below 30 degrees C (86 degrees F). Protect from light. Keep container tightly closed. Throw away any unused medicine after the expiration date. NOTE: This sheet is a summary. It may not cover all possible information. If you have questions about this medicine, talk to your doctor, pharmacist, or health care provider.  2018 [employer] Standard (2009-11-26 18:06:11)

## 2017-03-12 ENCOUNTER — Encounter: Payer: Self-pay | Admitting: Nurse Practitioner

## 2017-03-30 ENCOUNTER — Encounter: Payer: Self-pay | Admitting: Internal Medicine

## 2017-03-30 ENCOUNTER — Ambulatory Visit: Payer: BLUE CROSS/BLUE SHIELD | Admitting: Internal Medicine

## 2017-03-30 ENCOUNTER — Other Ambulatory Visit (INDEPENDENT_AMBULATORY_CARE_PROVIDER_SITE_OTHER): Payer: BLUE CROSS/BLUE SHIELD

## 2017-03-30 VITALS — BP 118/80 | HR 84 | Temp 97.8°F | Ht 70.0 in | Wt 196.0 lb

## 2017-03-30 DIAGNOSIS — Z0000 Encounter for general adult medical examination without abnormal findings: Secondary | ICD-10-CM | POA: Diagnosis not present

## 2017-03-30 DIAGNOSIS — Z114 Encounter for screening for human immunodeficiency virus [HIV]: Secondary | ICD-10-CM

## 2017-03-30 DIAGNOSIS — F419 Anxiety disorder, unspecified: Secondary | ICD-10-CM

## 2017-03-30 LAB — CBC WITH DIFFERENTIAL/PLATELET
Basophils Absolute: 0 10*3/uL (ref 0.0–0.1)
Basophils Relative: 0.4 % (ref 0.0–3.0)
EOS ABS: 0.3 10*3/uL (ref 0.0–0.7)
Eosinophils Relative: 3.5 % (ref 0.0–5.0)
HCT: 42.8 % (ref 39.0–52.0)
HEMOGLOBIN: 14.6 g/dL (ref 13.0–17.0)
Lymphocytes Relative: 37.1 % (ref 12.0–46.0)
Lymphs Abs: 3.4 10*3/uL (ref 0.7–4.0)
MCHC: 34 g/dL (ref 30.0–36.0)
MCV: 87.7 fl (ref 78.0–100.0)
MONO ABS: 0.6 10*3/uL (ref 0.1–1.0)
Monocytes Relative: 7.1 % (ref 3.0–12.0)
Neutro Abs: 4.7 10*3/uL (ref 1.4–7.7)
Neutrophils Relative %: 51.9 % (ref 43.0–77.0)
Platelets: 245 10*3/uL (ref 150.0–400.0)
RBC: 4.88 Mil/uL (ref 4.22–5.81)
RDW: 12.1 % (ref 11.5–15.5)
WBC: 9.1 10*3/uL (ref 4.0–10.5)

## 2017-03-30 MED ORDER — ESCITALOPRAM OXALATE 10 MG PO TABS: 10.0000 mg | ORAL_TABLET | Freq: Every day | ORAL | 3 refills | Status: DC

## 2017-03-30 MED ORDER — BUSPIRONE HCL 10 MG PO TABS: 10.0000 mg | ORAL_TABLET | Freq: Three times a day (TID) | ORAL | 1 refills | Status: DC | PRN

## 2017-03-30 NOTE — Progress Notes (Signed)
Subjective:    Patient ID: Austin Carr, male    DOB: 11-07-1980, 36 y.o.   MRN: [id number]  HPI  Here for wellness and f/u;  Overall doing ok;  Pt denies Chest pain, worsening SOB, DOE, wheezing, orthopnea, PND, worsening LE edema, palpitations, dizziness or syncope.  Pt denies neurological change such as new headache, facial or extremity weakness.  Pt denies polydipsia, polyuria, or low sugar symptoms. Pt states overall good compliance with treatment and medications, good tolerability, and has been trying to follow appropriate diet.  Pt denies worsening depressive symptoms, suicidal ideation or panic, but has ongoing anxiety not as well controlled by buspar alone, seems to wear off quickly though does help. No fever, night sweats, wt loss, loss of appetite, or other constitutional symptoms.  Pt states good ability with ADL's, has low fall risk, home safety reviewed and adequate, no other significant changes in hearing or vision, and only occasionally active with exercise. No other interval hx Past Medical History:  Diagnosis Date  . Allergy   . Asthma    History reviewed. No pertinent surgical history.  reports that  has never smoked. he has never used smokeless tobacco. He reports that he drinks alcohol. He reports that he does not use drugs. family history includes Allergies in his brother; Asthma in his brother; Benign prostatic hyperplasia in his father; Healthy in his maternal grandmother and mother; Multiple myeloma in his maternal grandfather; Stroke in his father. Allergies  Allergen Reactions  . Cephalosporins Hives    Itching on inside of body  . Sulfa Antibiotics Hives   Current Outpatient Medications on File Prior to Visit  Medication Sig Dispense Refill  . albuterol (PROVENTIL HFA;VENTOLIN HFA) 108 (90 Base) MCG/ACT inhaler Inhale 1-2 puffs into the lungs every 4 (four) hours as needed for wheezing or shortness of breath. 1 Inhaler 2  . fluticasone (FLONASE) 50 MCG/ACT nasal  spray Place 2 sprays into both nostrils daily. Takes March through May for Spring time allergies     No current facility-administered medications on file prior to visit.    Review of Systems Constitutional: Negative for other unusual diaphoresis, sweats, appetite or weight changes HENT: Negative for other worsening hearing loss, ear pain, facial swelling, mouth sores or neck stiffness.   Eyes: Negative for other worsening pain, redness or other visual disturbance.  Respiratory: Negative for other stridor or swelling Cardiovascular: Negative for other palpitations or other chest pain  Gastrointestinal: Negative for worsening diarrhea or loose stools, blood in stool, distention or other pain Genitourinary: Negative for hematuria, flank pain or other change in urine volume.  Musculoskeletal: Negative for myalgias or other joint swelling.  Skin: Negative for other color change, or other wound or worsening drainage.  Neurological: Negative for other syncope or numbness. Hematological: Negative for other adenopathy or swelling Psychiatric/Behavioral: Negative for hallucinations, other worsening agitation, SI, self-injury, or new decreased concentration \All other system neg per pt    Objective:   Physical Exam BP 118/80   Pulse 84   Temp 97.8 F (36.6 C) (Oral)   Ht _0  (1.778 m)   Wt 196 lb (88.9 kg)   SpO2 100%   BMI 28.12 kg/m  VS noted,  Constitutional: Pt is oriented to person, place, and time. Appears well-developed and well-nourished, in no significant distress and comfortable Head: Normocephalic and atraumatic  Eyes: Conjunctivae and EOM are normal. Pupils are equal, round, and reactive to light Right Ear: External ear normal without discharge Left Ear: External  ear normal without discharge Nose: Nose without discharge or deformity Mouth/Throat: Oropharynx is without other ulcerations and moist  Neck: Normal range of motion. Neck supple. No JVD present. No tracheal  deviation present or significant neck LA or mass Cardiovascular: Normal rate, regular rhythm, normal heart sounds and intact distal pulses.   Pulmonary/Chest: WOB normal and breath sounds without rales or wheezing  Abdominal: Soft. Bowel sounds are normal. NT. No HSM  Musculoskeletal: Normal range of motion. Exhibits no edema Lymphadenopathy: Has no other cervical adenopathy.  Neurological: Pt is alert and oriented to person, place, and time. Pt has normal reflexes. No cranial nerve deficit. Motor grossly intact, Gait intact Skin: Skin is warm and dry. No rash noted or new ulcerations Psychiatric:  Has normal mood and affect. Behavior is normal without agitation No other exam findings    Assessment & Plan:

## 2017-03-30 NOTE — Patient Instructions (Signed)
.  Please take all new medication as prescribed - the lexapro  Please continue all other medications as before, and refills have been done if requeste  - the buspar as needed  Please have the pharmacy call with any other refills you may need.  Please continue your efforts at being more active, low cholesterol diet, and weight control.  You are otherwise up to date with prevention measures today.  Please keep your appointments with your specialists as you may have planned  Please go to the LAB in the Basement (turn left off the elevator) for the tests to be done today  You will be contacted by phone if any changes need to be made immediately.  Otherwise, you will receive a letter about your results with an explanation, but please check with MyChart first.  Please remember to sign up for MyChart if you have not done so, as this will be important to you in the future with finding out test results, communicating by private email, and scheduling acute appointments online when needed.  Please return in 1 year for your yearly visit, or sooner if needed

## 2017-03-30 NOTE — Assessment & Plan Note (Signed)
To cont prn buspar, add lexapro 10 qd

## 2017-03-30 NOTE — Assessment & Plan Note (Signed)

## 2017-03-31 LAB — URINALYSIS, ROUTINE W REFLEX MICROSCOPIC
Bilirubin Urine: NEGATIVE
Hgb urine dipstick: NEGATIVE
KETONES UR: NEGATIVE
Leukocytes, UA: NEGATIVE
Nitrite: NEGATIVE
Specific Gravity, Urine: 1.02 (ref 1.000–1.030)
Total Protein, Urine: NEGATIVE
UROBILINOGEN UA: 0.2 (ref 0.0–1.0)
Urine Glucose: NEGATIVE
pH: 7 (ref 5.0–8.0)

## 2017-03-31 LAB — BASIC METABOLIC PANEL
BUN: 17 mg/dL (ref 6–23)
CO2: 28 meq/L (ref 19–32)
Calcium: 9.8 mg/dL (ref 8.4–10.5)
Chloride: 103 mEq/L (ref 96–112)
Creatinine, Ser: 0.94 mg/dL (ref 0.40–1.50)
GFR: 96.57 mL/min (ref >60.00)
GLUCOSE: 92 mg/dL (ref 70–99)
POTASSIUM: 4 meq/L (ref 3.5–5.1)
Sodium: 137 mEq/L (ref 135–145)

## 2017-03-31 LAB — LIPID PANEL
CHOLESTEROL: 244 mg/dL — AB (ref 0–200)
HDL: 36.9 mg/dL — AB (ref >39.00)
NonHDL: 207.46
Total CHOL/HDL Ratio: 7
Triglycerides: 379 mg/dL — ABNORMAL HIGH (ref 0.0–149.0)
VLDL: 75.8 mg/dL — AB (ref 0.0–40.0)

## 2017-03-31 LAB — TSH: TSH: 2.72 u[IU]/mL (ref 0.35–4.50)

## 2017-03-31 LAB — HEPATIC FUNCTION PANEL
ALT: 23 U/L (ref 0–53)
AST: 18 U/L (ref 0–37)
Albumin: 4.7 g/dL (ref 3.5–5.2)
Alkaline Phosphatase: 66 U/L (ref 39–117)
BILIRUBIN DIRECT: 0 mg/dL (ref 0.0–0.3)
BILIRUBIN TOTAL: 0.4 mg/dL (ref 0.2–1.2)
Total Protein: 7.4 g/dL (ref 6.0–8.3)

## 2017-03-31 LAB — HIV ANTIBODY (ROUTINE TESTING W REFLEX): HIV 1&2 Ab, 4th Generation: NONREACTIVE

## 2017-03-31 LAB — LDL CHOLESTEROL, DIRECT: LDL DIRECT: 141 mg/dL

## 2017-04-07 ENCOUNTER — Encounter: Payer: Self-pay | Admitting: Internal Medicine

## 2017-04-07 DIAGNOSIS — E785 Hyperlipidemia, unspecified: Secondary | ICD-10-CM

## 2017-04-10 NOTE — Telephone Encounter (Signed)
Ok for staff to contact pt for Nurse Visit for PPD to be done

## 2017-04-18 ENCOUNTER — Ambulatory Visit: Payer: BLUE CROSS/BLUE SHIELD | Admitting: Internal Medicine

## 2017-06-21 ENCOUNTER — Other Ambulatory Visit: Payer: Self-pay | Admitting: Internal Medicine

## 2017-06-21 MED ORDER — ESCITALOPRAM OXALATE 10 MG PO TABS
10.0000 mg | ORAL_TABLET | Freq: Every day | ORAL | 2 refills | Status: AC
Start: 2017-06-21 — End: 2017-09-19

## 2017-09-02 ENCOUNTER — Other Ambulatory Visit: Payer: Self-pay | Admitting: Internal Medicine

## 2017-10-01 ENCOUNTER — Other Ambulatory Visit: Payer: Self-pay | Admitting: Internal Medicine

## 2018-04-05 ENCOUNTER — Encounter: Payer: BLUE CROSS/BLUE SHIELD | Admitting: Internal Medicine

## 2018-08-22 ENCOUNTER — Encounter: Payer: Self-pay | Admitting: Internal Medicine

## 2021-12-14 IMAGING — CR ANKCMRT
2 series · 2 of 2 positions shown · non-contrast
Comparison: none

[ankle ap]
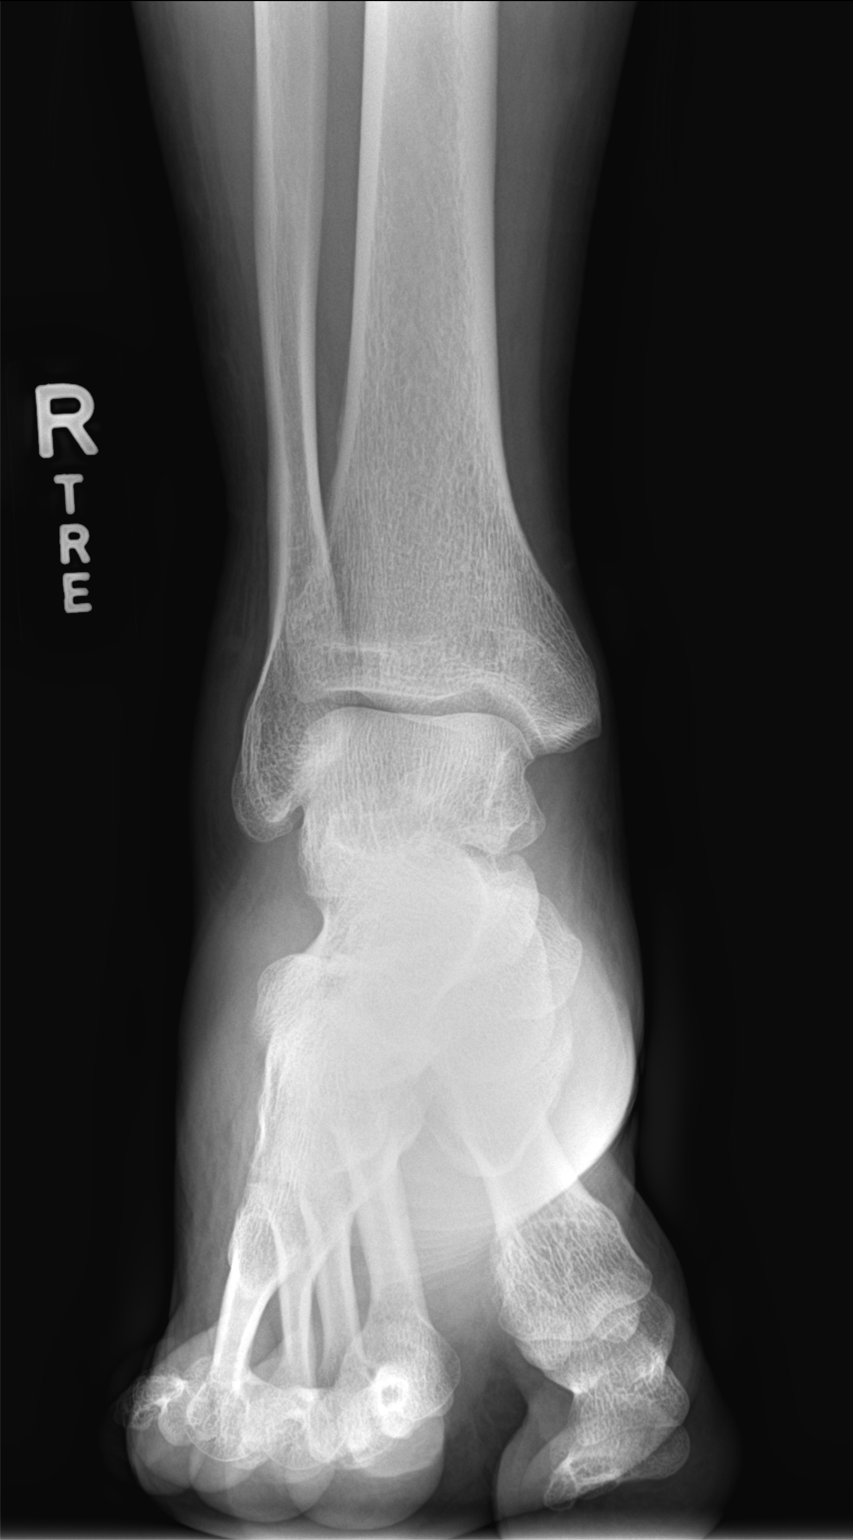

[ankle obl]
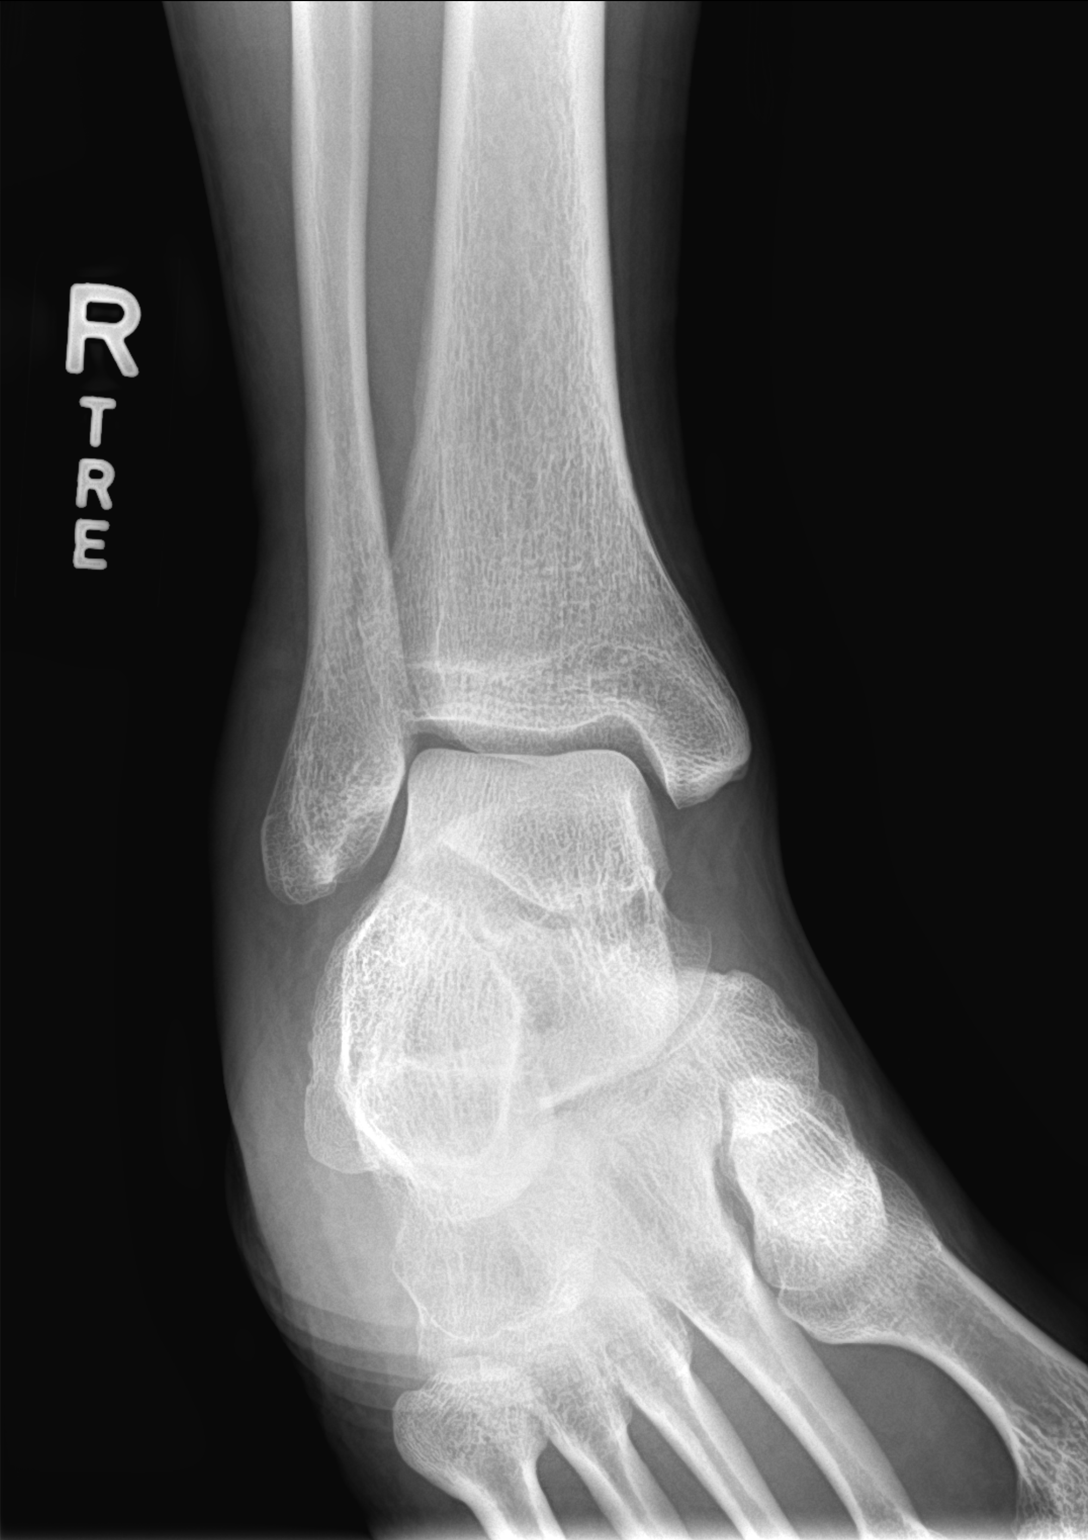

[2 of 2 positions shown; findings below may reference images not displayed]

EXAM

Right ankle

INDICATION

Right ankle injury
ROLLED RIGHT ANKLE, LATERAL PAIN AND BRUISING TB

FINDINGS

Three views of the right ankle were obtained.

There is no evidence of fracture or dislocation. The ankle mortise appears normal.

There is soft tissue swelling over the lateral malleolus.

IMPRESSION

There is no evidence of fracture or dislocation. There is soft tissue swelling laterally.

Tech Notes:

ROLLED RIGHT ANKLE, LATERAL PAIN AND BRUISING TB
